# Patient Record
Sex: Female | Born: 1980 | Race: Asian | Hispanic: No | Marital: Married | Smoking: Never smoker
Health system: Southern US, Community
[De-identification: ages and names within clinical notes are randomized; demographics above are authoritative.]

## PROBLEM LIST (undated history)

## (undated) ENCOUNTER — Inpatient Hospital Stay (HOSPITAL_COMMUNITY): Payer: Self-pay

---

## 2010-01-27 ENCOUNTER — Inpatient Hospital Stay (HOSPITAL_COMMUNITY): Admission: AD | Admit: 2010-01-27 | Discharge: 2010-01-27 | Payer: Self-pay | Admitting: Obstetrics and Gynecology

## 2010-01-27 ENCOUNTER — Ambulatory Visit: Payer: Self-pay | Admitting: Nurse Practitioner

## 2010-02-12 ENCOUNTER — Inpatient Hospital Stay (HOSPITAL_COMMUNITY): Admission: AD | Admit: 2010-02-12 | Discharge: 2010-02-12 | Payer: Self-pay | Admitting: Obstetrics & Gynecology

## 2010-02-12 ENCOUNTER — Ambulatory Visit: Payer: Self-pay | Admitting: Obstetrics and Gynecology

## 2010-04-09 ENCOUNTER — Ambulatory Visit (HOSPITAL_COMMUNITY): Admission: RE | Admit: 2010-04-09 | Discharge: 2010-04-09 | Payer: Self-pay | Admitting: Family Medicine

## 2010-07-16 ENCOUNTER — Inpatient Hospital Stay (HOSPITAL_COMMUNITY)
Admission: AD | Admit: 2010-07-16 | Discharge: 2010-07-16 | Payer: Self-pay | Source: Home / Self Care | Attending: Family Medicine | Admitting: Family Medicine

## 2010-09-10 ENCOUNTER — Inpatient Hospital Stay (HOSPITAL_COMMUNITY)
Admission: AD | Admit: 2010-09-10 | Discharge: 2010-09-13 | DRG: 766 | Disposition: A | Discharge status: Home / Self Care | Payer: Medicaid Other | Source: Ambulatory Visit | Attending: Obstetrics & Gynecology | Admitting: Obstetrics & Gynecology

## 2010-09-10 ENCOUNTER — Other Ambulatory Visit: Payer: Self-pay | Admitting: Obstetrics & Gynecology

## 2010-09-10 DIAGNOSIS — O9903 Anemia complicating the puerperium: Secondary | ICD-10-CM | POA: Diagnosis not present

## 2010-09-10 DIAGNOSIS — D649 Anemia, unspecified: Secondary | ICD-10-CM | POA: Diagnosis not present

## 2010-09-10 LAB — CBC
MCV: 89.1 fL (ref 78.0–100.0)
Platelets: 170 10*3/uL (ref 150–400)
RDW: 13.9 % (ref 11.5–15.5)
WBC: 9.4 10*3/uL (ref 4.0–10.5)

## 2010-09-10 LAB — RPR: RPR Ser Ql: NONREACTIVE

## 2010-09-11 DIAGNOSIS — O9903 Anemia complicating the puerperium: Secondary | ICD-10-CM

## 2010-09-11 DIAGNOSIS — D649 Anemia, unspecified: Secondary | ICD-10-CM

## 2010-09-11 LAB — CBC
Hemoglobin: 7.8 g/dL — ABNORMAL LOW (ref 12.0–15.0)
MCH: 30.2 pg (ref 26.0–34.0)
MCHC: 34.1 g/dL (ref 30.0–36.0)
MCV: 89.2 fL (ref 78.0–100.0)
Platelets: 140 10*3/uL — ABNORMAL LOW (ref 150–400)
RDW: 14.1 % (ref 11.5–15.5)
RDW: 13.9 % (ref 11.5–15.5)
WBC: 14.6 10*3/uL — ABNORMAL HIGH (ref 4.0–10.5)

## 2010-09-21 NOTE — Op Note (Addendum)
Virginia Mooney, MALES             ACCOUNT NO.:  [id number]  MEDICAL RECORD NO.:  [id number]           PATIENT TYPE:  I  LOCATION:  9135                          [facility]  PHYSICIAN:  Lesly Dukes, M.D. DATE OF BIRTH:  1980/08/27  DATE OF PROCEDURE:  09/11/2010 DATE OF DISCHARGE:                              OPERATIVE REPORT   PREOPERATIVE DIAGNOSES: 1. Intrauterine pregnancy at 39 weeks and 2 days. 2. Nonreassuring fetal heart tones.  POSTOPERATIVE DIAGNOSES: 1. Intrauterine pregnancy at 39 weeks and 2 days. 2. Nonreassuring fetal heart tones.  PROCEDURE:  Primary low-transverse cesarean section via Pfannenstiel.  SURGEON:  Lesly Dukes, MD and Maryelizabeth Kaufmann, MD.  ANESTHESIA:  Epidural.  IV FLUIDS:  3700 mL.  URINE OUTPUT:  400 mL of clear urine at the end of the procedure.  ESTIMATED BLOOD LOSS:  1500 mL.  FINDINGS:  A viable female infant in occiput transverse presentation with meconium-stained amniotic fluid.  Apgars were 6, 8, and 9.  Cord pH was 7.33.  Weight was 5 pounds and 11 ounces.  Normal uterus and adnexa bilaterally.  SPECIMENS:  Placenta sent to Pathology.  COMPLICATIONS:  None immediate.  INDICATIONS:  This is a 30 year old gravida 1 who presented on the day of admission at 39 weeks and 2 days with spontaneous rupture of membranes with meconium.  The patient was progressed spontaneously, and the patient was expectantly managed.  The patient did have some intermittent late decelerations that resolved.  Variability remained stable and moderate.  Fetal heart rate remained in the 120s-130s.  The patient then was making adequate cervical change.  She was augmented with Pitocin because her contractions were protracted.  She progressed to complete and +1.  However, with pushing, there was notable fetal bradycardia and then marked variability.  It was also noted that the infant to be asynclitic because the patient was remote from  immediate delivery and she was only at +1 station, decision was made to proceed with a primary low-transverse cesarean section for nonreassuring fetal heart tones.  The patient was counseled with risks, benefits, and alternatives and she desired to proceed with cesarean section for the aforementioned reasons.  PROCEDURE IN DETAIL:  After informed consent was obtained with the translator, the patient was taken back to the operative suite where epidural was redosed.  After anesthesia was found to be adequate, the patient was placed in the dorsal supine position with a leftward tilt. The patient was prepped and draped in normal sterile fashion.  Again, anesthesia was found to be adequate and a Pfannenstiel skin incision was then made with a scalpel and carried down through to the underlying fascia.  The fascial incision was then incised in the midline.  The fascial incision was then extended laterally with the Mayo scissors. The superior aspect of the fascial incision was then grasped with Kocher clamps, elevated, and tented up and the rectus muscles were then dissected off bluntly.  Attention was then turned to the inferior aspect, which in a similar fashion was grasped with the Kocher clamps, elevated, tented up, and the rectus muscle were then separated in the midline  and the peritoneum was entered bluntly.  An Alexis O ring retractor was then inserted into the abdomen.  The lower uterine segment was then incised in transverse fashion with a scalpel.  The uterine incision was then extended manually.  It was found to be in occiput transverse presentation, was delivered otherwise in vertex presentation atraumatically.  Mouth and nose were bulb suctioned.  Cord was cut and clamped.  The infant was handed off to awaiting NICU.  Apgars were 6, 8, and 9.  Cord pH was 7.33.  Cord blood was sampled.  Weight was 5 pounds and 11 ounces.  Placenta was then delivered spontaneously intact  with three-vessel cord.  The uterus was then cleared of all clots and debris. The uterine incision was then repaired with an 0 Vicryl in a two-layer closure with the initial layer being running interlocking and the second layer being imbricated.  The uterine incision did extend laterally into the left uterine artery and it was repaired.  Hemostasis was found to be adequate, and again the uterine incision was then closed using a two- layer closure with a second layer being imbricated.  Hemostasis was found to be adequate.  Bilateral adnexa were then inspected and were found to be normal.  The peritoneum was then irrigated, cleared of all clots and debris, and there was one area on the uterine incision that needed one figure-of-eight that was repaired using a 2-0 Monocryl, otherwise the uterine incision was then hemostatic.  The fascial incision was then repaired with running stitch using 0 Vicryl.  The subcutaneous tissue was then irrigated.  Additional hemostasis was then obtained with the electrocautery.  The skin was then closed with staples.  Lap, needle, and sponge counts were correct x2.  Antibiotics were given perioperatively.  There were no complications immediately, and the patient was taken back to the recovery area in stable condition.    ______________________________ Maryelizabeth Kaufmann, MD   ______________________________ Lesly Dukes, M.D.    LC/MEDQ  D:  09/11/2010  T:  09/11/2010  Job:  [id number]  Electronically Signed by Maryelizabeth Kaufmann MD on 09/21/2010 10:06:53 AM Electronically Signed by Elsie Lincoln M.D. on 10/30/2010 11:20:24 AM

## 2010-09-28 LAB — URINALYSIS, ROUTINE W REFLEX MICROSCOPIC
Protein, ur: NEGATIVE mg/dL
Urobilinogen, UA: 0.2 mg/dL (ref 0.0–1.0)

## 2010-09-28 LAB — FETAL FIBRONECTIN: Fetal Fibronectin: POSITIVE — AB

## 2010-10-03 LAB — URINALYSIS, ROUTINE W REFLEX MICROSCOPIC
Bilirubin Urine: NEGATIVE
Ketones, ur: >80 mg/dL — AB
Nitrite: NEGATIVE
Protein, ur: NEGATIVE mg/dL
Urobilinogen, UA: 0.2 mg/dL (ref 0.0–1.0)

## 2010-10-03 LAB — COMPREHENSIVE METABOLIC PANEL
AST: 14 U/L (ref 0–37)
Albumin: 3.8 g/dL (ref 3.5–5.2)
CO2: 24 mEq/L (ref 19–32)
Calcium: 8.9 mg/dL (ref 8.4–10.5)
Creatinine, Ser: 0.51 mg/dL (ref 0.4–1.2)
GFR calc Af Amer: >60 mL/min (ref >60)
GFR calc non Af Amer: >60 mL/min (ref >60)

## 2010-10-03 LAB — CBC
MCH: 31.2 pg (ref 26.0–34.0)
MCHC: 34.4 g/dL (ref 30.0–36.0)
Platelets: 187 10*3/uL (ref 150–400)

## 2010-10-04 LAB — URINALYSIS, ROUTINE W REFLEX MICROSCOPIC
Bilirubin Urine: NEGATIVE
Leukocytes, UA: NEGATIVE
Nitrite: NEGATIVE
Specific Gravity, Urine: >1.03 — ABNORMAL HIGH (ref 1.005–1.030)
pH: 6 (ref 5.0–8.0)

## 2010-10-04 LAB — URINE MICROSCOPIC-ADD ON

## 2010-10-06 NOTE — Discharge Summary (Signed)
  Virginia Mooney, Virginia Mooney             ACCOUNT NO.:  [id number]  MEDICAL RECORD NO.:  [id number]           PATIENT TYPE:  I  LOCATION:  9135                          [facility]  PHYSICIAN:  Catalina Antigua, MD     DATE OF BIRTH:  October 11, 1980  DATE OF ADMISSION:  09/10/2010 DATE OF DISCHARGE:                              DISCHARGE SUMMARY   REASON FOR ADMISSION:  Pregnancy at term with first-degree low- transverse cesarean section by Dr. Penne Lash and Dr. Orvan Falconer for nonreassuring fetal heart rate tones.  FINDINGS:  There is a viable female infant vertex presentation with Apgars of 6, 8 and 10.  pH on cord gas was 7.33, weight was 5 pounds 11 ounces.  DISCHARGE DIAGNOSIS:  Pregnancy at term with first-degree low-transverse cesarean section by Dr. Penne Lash and Dr. Orvan Falconer for nonreassuring fetal heart rate tones.  HOSPITAL COURSE:  Has been uneventful.  The patient is up ambulating well, taking p.o. fluids and solids well, has had a bowel movement, is voiding without any problems and today is ready for discharge.  DIET:  As tolerated.  ACTIVITY LEVEL:  No heavy lifting or driving for 2 weeks postop.  FOLLOWUP:  The [facility] Love nurses is to go out on postop day 5 through 7 and remove her staples.  No blood pressure check and she is to follow up with the [facility] in 6 weeks.  PHYSICAL EXAMINATION:  VITAL SIGNS:  Today, were stable. HEART:  Regular rhythm and rate. LUNGS:  Clear to auscultation bilaterally. ABDOMEN:  Soft.  Bowel sounds are present in all four quadrants. SKIN:  Incision is intact.  There is no redness, drainage or swelling. Fundus is firm.  Lochia is small amount. EXTREMITIES:  There is no edema in the lower extremities.  DISCHARGE MEDICATIONS: 1. FeSO4 325 1 p.o. b.i.d. 2. Motrin 600 p.o. q.6 h. p.r.n. cramping. 3. Percocet 5/325 one p.o. q.4 h. p.r.n. pain.     Zerita Boers, N.M.   ______________________________ Catalina Antigua, MD    DL/MEDQ  D:  [id number]  T:  09/13/2010  Job:  [id number]  cc:   [facility]  Electronically Signed by Wyvonnia Dusky N.M. on 10/04/2010 09:14:08 AM Electronically Signed by Catalina Antigua  on 10/06/2010 02:03:12 PM

## 2010-11-26 ENCOUNTER — Encounter (INDEPENDENT_AMBULATORY_CARE_PROVIDER_SITE_OTHER): Payer: Medicaid Other | Admitting: Obstetrics and Gynecology

## 2010-11-27 NOTE — Group Therapy Note (Signed)
Virginia Mooney, Virginia Mooney             ACCOUNT NO.:  [id number]  MEDICAL RECORD NO.:  [id number]           PATIENT TYPE:  A  LOCATION:  [facility]                   FACILITY:  [facility]  PHYSICIAN:  Argentina Donovan, MD        DATE OF BIRTH:  1980-07-28  DATE OF SERVICE:  11/26/2010                                 CLINIC NOTE  The patient is a 30 year old gravida 1, para 1-0-0-1 who has only been married for 1 year who is native of [place] in [place] who got her prenatal care at the health department, was delivered here at [facility] on February 9 of this year.  Following the C-section which was done she has had at the last minute when she was fully dilated but was unable to push the baby out in which she had a comfortable labor secondary to the epidural.  She delivered about 11 o'clock at night and by morning her lower extremities were still numb, although the epidural had been stopped considerably earlier than that.  I do not have her records so I am not sure whether she got Duramorph or not, but I expect she did.  She is not sure what they did to her feet she said, but they apparently got an anesthetic consultation in to see her because of numbness had lasted longer than it should have.  Once it wore off, she had her 6-weeks checkup at the [facility].  She takes no medications except multivitamins and iron and numbness seems to persists, it is not constant and seems to come and go and she is walking.  It makes her feel shaky, very unstable and she feels she may fall, although she states she never has.  I am not exactly sure what is going on with this patient, so I am going to get a neurological referral and hopefully they can rule out any significant problem.  IMPRESSION:  Postpartum leg weakness and numbness, intermittent.          ______________________________ Argentina Donovan, MD    PR/MEDQ  D:  11/26/2010  T:  11/27/2010  Job:  [id number]

## 2011-10-24 ENCOUNTER — Encounter (HOSPITAL_BASED_OUTPATIENT_CLINIC_OR_DEPARTMENT_OTHER): Payer: Self-pay

## 2011-10-24 ENCOUNTER — Emergency Department (INDEPENDENT_AMBULATORY_CARE_PROVIDER_SITE_OTHER): Payer: Medicaid Other

## 2011-10-24 ENCOUNTER — Emergency Department (HOSPITAL_BASED_OUTPATIENT_CLINIC_OR_DEPARTMENT_OTHER): Payer: Medicaid Other

## 2011-10-24 ENCOUNTER — Emergency Department (HOSPITAL_BASED_OUTPATIENT_CLINIC_OR_DEPARTMENT_OTHER)
Admission: EM | Admit: 2011-10-24 | Discharge: 2011-10-24 | Disposition: A | Discharge status: Home / Self Care | Payer: Medicaid Other | Attending: Emergency Medicine | Admitting: Emergency Medicine

## 2011-10-24 DIAGNOSIS — R05 Cough: Secondary | ICD-10-CM

## 2011-10-24 DIAGNOSIS — M79603 Pain in arm, unspecified: Secondary | ICD-10-CM

## 2011-10-24 DIAGNOSIS — R059 Cough, unspecified: Secondary | ICD-10-CM

## 2011-10-24 DIAGNOSIS — M79609 Pain in unspecified limb: Secondary | ICD-10-CM | POA: Insufficient documentation

## 2011-10-24 MED ORDER — NAPROXEN 500 MG PO TABS: 500.0000 mg | ORAL_TABLET | Freq: Two times a day (BID) | ORAL | Status: DC

## 2011-10-24 MED ORDER — BENZONATATE 100 MG PO CAPS: 100.0000 mg | ORAL_CAPSULE | Freq: Three times a day (TID) | ORAL | Status: AC

## 2011-10-24 MED ORDER — IBUPROFEN 800 MG PO TABS
800.0000 mg | ORAL_TABLET | Freq: Once | ORAL | Status: AC
  Administered 2011-10-24: 800 mg via ORAL
  Filled 2011-10-24: qty 1

## 2011-10-24 NOTE — ED Provider Notes (Signed)
History     CSN: [id number]  Arrival date & time 10/24/11  1139   First MD Initiated Contact with Patient 10/24/11 1201      Chief Complaint  Patient presents with  . Cough    (Consider location/radiation/quality/duration/timing/severity/associated sxs/prior treatment) HPI Comments: Also complains of intermittent bilateral arm pain. Present in the upper portion of her arm. Has full range of motion. No specific exacerbating or alleviating measures  Patient is a 31 y.o. female presenting with URI. The history is provided by the patient. No language interpreter was used.  URI The primary symptoms include cough. Primary symptoms do not include fever, fatigue, headaches, sore throat, wheezing, abdominal pain, nausea, vomiting, myalgias or arthralgias. The current episode started 6 to 7 days ago. This is a new problem. The problem has not changed since onset. The cough began 6 to 7 days ago. The cough is new. The cough is dry. There is nondescript sputum produced.  Symptoms associated with the illness include congestion and rhinorrhea. The illness is not associated with chills, plugged ear sensation, facial pain or sinus pressure.    History reviewed. No pertinent past medical history.  Past Surgical History  Procedure Date  . Cesarean section     History reviewed. No pertinent family history.  History  Substance Use Topics  . Smoking status: Never Smoker   . Smokeless tobacco: Never Used  . Alcohol Use: No    OB History    Grav Para Term Preterm Abortions TAB SAB Ect Mult Living                  Review of Systems  Constitutional: Negative for fever, chills, activity change, appetite change and fatigue.  HENT: Positive for congestion and rhinorrhea. Negative for sore throat, neck pain, neck stiffness and sinus pressure.   Respiratory: Positive for cough. Negative for shortness of breath and wheezing.   Cardiovascular: Negative for chest pain and palpitations.   Gastrointestinal: Negative for nausea, vomiting and abdominal pain.  Genitourinary: Negative for dysuria, urgency, frequency and flank pain.  Musculoskeletal: Negative for myalgias, back pain and arthralgias.  Neurological: Negative for dizziness, weakness, light-headedness, numbness and headaches.  All other systems reviewed and are negative.    Allergies  Review of patient's allergies indicates no known allergies.  Home Medications   Current Outpatient Rx  Name Route Sig Dispense Refill  . ACETAMINOPHEN 500 MG PO TABS Oral Take 1,000 mg by mouth every 6 (six) hours as needed.    . NYQUIL PO Oral Take 15 mLs by mouth as needed.    Marland Kitchen BENZONATATE 100 MG PO CAPS Oral Take 1 capsule (100 mg total) by mouth every 8 (eight) hours. 21 capsule 0  . NAPROXEN 500 MG PO TABS Oral Take 1 tablet (500 mg total) by mouth 2 (two) times daily. 30 tablet 0    BP 107/70  Pulse 95  Temp(Src) 98.3 F (36.8 C) (Oral)  Resp 17  Ht 4\' 11"  (1.499 m)  Wt 138 lb 9.6 oz (62.869 kg)  BMI 27.99 kg/m2  SpO2 98%  LMP 10/04/2011  Physical Exam  Nursing note and vitals reviewed. Constitutional: She is oriented to person, place, and time. She appears well-developed and well-nourished. No distress.  HENT:  Head: Normocephalic and atraumatic.  Mouth/Throat: Oropharynx is clear and moist.  Eyes: Conjunctivae and EOM are normal. Pupils are equal, round, and reactive to light.  Neck: Normal range of motion. Neck supple.  Cardiovascular: Normal rate, regular rhythm, normal heart  sounds and intact distal pulses.  Exam reveals no gallop and no friction rub.   No murmur heard. Pulmonary/Chest: Effort normal and breath sounds normal. No respiratory distress.  Abdominal: Soft. Bowel sounds are normal. There is no tenderness. There is no rebound and no guarding.  Musculoskeletal: Normal range of motion. She exhibits no edema and no tenderness.       Could not elicit any pain in the UE bilaterally.  No swelling.   Full rom  Lymphadenopathy:    She has no cervical adenopathy.  Neurological: She is alert and oriented to person, place, and time. No cranial nerve deficit.  Skin: Skin is warm and dry.    ED Course  Procedures (including critical care time)  Labs Reviewed - No data to display Dg Chest 2 View  10/24/2011  *RADIOLOGY REPORT*  Clinical Data: Cough  CHEST - 2 VIEW  Comparison: None.  Findings: Cardiomediastinal silhouette is unremarkable.  No acute infiltrate or pleural effusion.  No pulmonary edema.  Bony thorax is unremarkable.  IMPRESSION: No active disease.  Original Report Authenticated By: Natasha Mead, M.D.     1. Cough   2. Arm pain       MDM  Negative x-ray was provided Jerilynn Som for her cough. Was unable to elicit any sort of pain in her upper extremities. Will provide Naprosyn. No indication for testing or imaging at this time. There is no evidence of DVT or swelling. Instructed to followup with provided primary care physician.        Dayton Bailiff, MD 10/24/11 1325

## 2011-10-24 NOTE — ED Notes (Signed)
Pt states that she has had a cough for about 4-5 days, and c/o of intermittent arm pain.

## 2011-10-24 NOTE — Discharge Instructions (Signed)

## 2011-11-10 ENCOUNTER — Emergency Department (INDEPENDENT_AMBULATORY_CARE_PROVIDER_SITE_OTHER): Payer: Medicaid Other

## 2011-11-10 ENCOUNTER — Emergency Department (HOSPITAL_BASED_OUTPATIENT_CLINIC_OR_DEPARTMENT_OTHER)
Admission: EM | Admit: 2011-11-10 | Discharge: 2011-11-10 | Disposition: A | Discharge status: Home / Self Care | Payer: Medicaid Other | Attending: Emergency Medicine | Admitting: Emergency Medicine

## 2011-11-10 ENCOUNTER — Encounter (HOSPITAL_BASED_OUTPATIENT_CLINIC_OR_DEPARTMENT_OTHER): Payer: Self-pay | Admitting: Emergency Medicine

## 2011-11-10 DIAGNOSIS — R002 Palpitations: Secondary | ICD-10-CM | POA: Insufficient documentation

## 2011-11-10 DIAGNOSIS — I498 Other specified cardiac arrhythmias: Secondary | ICD-10-CM | POA: Insufficient documentation

## 2011-11-10 DIAGNOSIS — R079 Chest pain, unspecified: Secondary | ICD-10-CM | POA: Insufficient documentation

## 2011-11-10 DIAGNOSIS — M542 Cervicalgia: Secondary | ICD-10-CM | POA: Insufficient documentation

## 2011-11-10 DIAGNOSIS — R5381 Other malaise: Secondary | ICD-10-CM | POA: Insufficient documentation

## 2011-11-10 DIAGNOSIS — R42 Dizziness and giddiness: Secondary | ICD-10-CM

## 2011-11-10 DIAGNOSIS — R5383 Other fatigue: Secondary | ICD-10-CM | POA: Insufficient documentation

## 2011-11-10 DIAGNOSIS — R9431 Abnormal electrocardiogram [ECG] [EKG]: Secondary | ICD-10-CM | POA: Insufficient documentation

## 2011-11-10 DIAGNOSIS — R0789 Other chest pain: Secondary | ICD-10-CM

## 2011-11-10 LAB — COMPREHENSIVE METABOLIC PANEL
ALT: 13 U/L (ref 0–35)
AST: 19 U/L (ref 0–37)
Alkaline Phosphatase: 94 U/L (ref 39–117)
CO2: 26 mEq/L (ref 19–32)
GFR calc Af Amer: >90 mL/min (ref >90)
GFR calc non Af Amer: >90 mL/min (ref >90)
Glucose, Bld: 111 mg/dL — ABNORMAL HIGH (ref 70–99)
Potassium: 3.6 mEq/L (ref 3.5–5.1)
Sodium: 139 mEq/L (ref 135–145)
Total Protein: 7.7 g/dL (ref 6.0–8.3)

## 2011-11-10 LAB — CBC
MCH: 29.9 pg (ref 26.0–34.0)
MCHC: 35.4 g/dL (ref 30.0–36.0)
Platelets: 218 10*3/uL (ref 150–400)
RBC: 4.69 MIL/uL (ref 3.87–5.11)
RDW: 13.3 % (ref 11.5–15.5)

## 2011-11-10 LAB — PREGNANCY, URINE: Preg Test, Ur: NEGATIVE

## 2011-11-10 LAB — DIFFERENTIAL
Basophils Absolute: 0 10*3/uL (ref 0.0–0.1)
Lymphocytes Relative: 28 % (ref 12–46)
Lymphs Abs: 2.5 10*3/uL (ref 0.7–4.0)
Neutrophils Relative %: 63 % (ref 43–77)

## 2011-11-10 LAB — URINALYSIS, ROUTINE W REFLEX MICROSCOPIC
Glucose, UA: NEGATIVE mg/dL
Leukocytes, UA: NEGATIVE
Protein, ur: NEGATIVE mg/dL
Urobilinogen, UA: 0.2 mg/dL (ref 0.0–1.0)

## 2011-11-10 LAB — TROPONIN I: Troponin I: <0.3 ng/mL (ref <0.30)

## 2011-11-10 MED ORDER — PROPRANOLOL HCL 10 MG PO TABS: 10.0000 mg | ORAL_TABLET | Freq: Every morning | ORAL | Status: DC

## 2011-11-10 MED ORDER — SODIUM CHLORIDE 0.9 % IV SOLN
INTRAVENOUS | Status: DC
  Administered 2011-11-10: 16:00:00 via INTRAVENOUS

## 2011-11-10 MED ORDER — METOPROLOL TARTRATE 1 MG/ML IV SOLN
5.0000 mg | Freq: Once | INTRAVENOUS | Status: AC
  Administered 2011-11-10: 5 mg via INTRAVENOUS
  Filled 2011-11-10: qty 5

## 2011-11-10 NOTE — ED Provider Notes (Addendum)
History     CSN: [id number]  Arrival date & time 11/10/11  1425   First MD Initiated Contact with Patient 11/10/11 1455      Chief Complaint  Patient presents with  . Chest Pain    (Consider location/radiation/quality/duration/timing/severity/associated sxs/prior treatment) HPI Comments: Patient is a 31 year old woman of [place] origin who complains of weakness, dizziness, and a feeling like heaviness in her chest and anterior neck. The feeling is like a discomfort. She also feels dizzy. She knows her heart rate is rapid today. She says her blood pressure usually runs around 99 systolic and now just above that and she thinks this is perhaps the cause of the trouble. There has been no prior similar episode.  Patient is a 31 y.o. female presenting with chest pain.  Chest Pain Episode onset: Today. Chest pain occurs constantly. The chest pain is unchanged. The severity of the pain is moderate. Quality: She describes the feeling as dizziness and a discomfort in her upper chest and anterior neck. Exacerbated by: Nothing. Primary symptoms include palpitations. Primary symptoms comment: Dizziness. She tried nothing for the symptoms. Risk factors include no known risk factors. Past medical history comments: No significant illnesses. Her only prior surgery was a cesarean section about a year ago.     History reviewed. No pertinent past medical history.  Past Surgical History  Procedure Date  . Cesarean section     No family history on file.  History  Substance Use Topics  . Smoking status: Never Smoker   . Smokeless tobacco: Never Used  . Alcohol Use: No    OB History    Grav Para Term Preterm Abortions TAB SAB Ect Mult Living                  Review of Systems  Constitutional: Negative.   HENT: Positive for neck pain.        Dry mouth.  Eyes: Negative.   Cardiovascular: Positive for chest pain and palpitations.  Gastrointestinal: Negative.   Genitourinary: Negative.    Skin: Negative.   Neurological: Negative.   Psychiatric/Behavioral: Negative.     Allergies  Review of patient's allergies indicates no known allergies.  Home Medications   Current Outpatient Rx  Name Route Sig Dispense Refill  . ACETAMINOPHEN 500 MG PO TABS Oral Take 1,000 mg by mouth every 6 (six) hours as needed.    Marland Kitchen NAPROXEN 500 MG PO TABS Oral Take 1 tablet (500 mg total) by mouth 2 (two) times daily. 30 tablet 0  . NYQUIL PO Oral Take 15 mLs by mouth as needed.      BP 125/83  Pulse 106  Temp(Src) 97.7 F (36.5 C) (Oral)  Resp 16  Ht 4\' 11"  (1.499 m)  Wt 138 lb 1.6 oz (62.642 kg)  BMI 27.89 kg/m2  SpO2 100%  LMP 10/04/2011  Physical Exam  Nursing note and vitals reviewed. Constitutional: She is oriented to person, place, and time. She appears well-developed and well-nourished.       In mild distress, complaining of pain in her upper chest and anterior neck, dizziness.  HENT:  Head: Normocephalic and atraumatic.  Right Ear: External ear normal.  Left Ear: External ear normal.  Mouth/Throat: Oropharynx is clear and moist.  Eyes: Conjunctivae and EOM are normal. Pupils are equal, round, and reactive to light.       No nystagmus.  Neck: Normal range of motion. Neck supple.  Cardiovascular: Regular rhythm and normal heart sounds.  Tachycardia between 110 and 120 as noted on her EKG and on the monitor.  Pulmonary/Chest: Effort normal and breath sounds normal.  Abdominal: Soft. Bowel sounds are normal.  Musculoskeletal: Normal range of motion. She exhibits no edema and no tenderness.  Neurological: She is alert and oriented to person, place, and time.       No sensory or motor deficit.  Skin: Skin is warm and dry.  Psychiatric: She has a normal mood and affect. Her behavior is normal.    ED Course  Procedures (including critical care time)   2:56 PM  Date: 11/10/2011  Rate:104  Rhythm: sinus tachycardia  QRS Axis: left  Intervals: normal PQRS:  Left atrial abnormality.  ST/T Wave abnormalities: nonspecific T wave changes  Conduction Disutrbances:none  Narrative Interpretation: Abnormal EKG.  Old EKG Reviewed: none available  3:30 PM Patient was seen and had physical examination laboratory tests were ordered. IV metoprolol was ordered.  Results for orders placed during the hospital encounter of 11/10/11  CBC      Component Value Range   WBC 8.7  4.0 - 10.5 (K/uL)   RBC 4.69  3.87 - 5.11 (MIL/uL)   Hemoglobin 14.0  12.0 - 15.0 (g/dL)   HCT 40.9  81.1 - 91.4 (%)   MCV 84.4  78.0 - 100.0 (fL)   MCH 29.9  26.0 - 34.0 (pg)   MCHC 35.4  30.0 - 36.0 (g/dL)   RDW 78.2  95.6 - 21.3 (%)   Platelets 218  150 - 400 (K/uL)  DIFFERENTIAL      Component Value Range   Neutrophils Relative 63  43 - 77 (%)   Neutro Abs 5.4  1.7 - 7.7 (K/uL)   Lymphocytes Relative 28  12 - 46 (%)   Lymphs Abs 2.5  0.7 - 4.0 (K/uL)   Monocytes Relative 8  3 - 12 (%)   Monocytes Absolute 0.7  0.1 - 1.0 (K/uL)   Eosinophils Relative 1  0 - 5 (%)   Eosinophils Absolute 0.1  0.0 - 0.7 (K/uL)   Basophils Relative 0  0 - 1 (%)   Basophils Absolute 0.0  0.0 - 0.1 (K/uL)  COMPREHENSIVE METABOLIC PANEL      Component Value Range   Sodium 139  135 - 145 (mEq/L)   Potassium 3.6  3.5 - 5.1 (mEq/L)   Chloride 102  96 - 112 (mEq/L)   CO2 26  19 - 32 (mEq/L)   Glucose, Bld 111 (*) 70 - 99 (mg/dL)   BUN 10  6 - 23 (mg/dL)   Creatinine, Ser 0.86  0.50 - 1.10 (mg/dL)   Calcium 9.6  8.4 - 57.8 (mg/dL)   Total Protein 7.7  6.0 - 8.3 (g/dL)   Albumin 4.1  3.5 - 5.2 (g/dL)   AST 19  0 - 37 (U/L)   ALT 13  0 - 35 (U/L)   Alkaline Phosphatase 94  39 - 117 (U/L)   Total Bilirubin 0.5  0.3 - 1.2 (mg/dL)   GFR calc non Af Amer >90  >90 (mL/min)   GFR calc Af Amer >90  >90 (mL/min)  URINALYSIS, ROUTINE W REFLEX MICROSCOPIC      Component Value Range   Color, Urine YELLOW  YELLOW    APPearance CLEAR  CLEAR    Specific Gravity, Urine 1.005  1.005 - 1.030    pH 7.5   5.0 - 8.0    Glucose, UA NEGATIVE  NEGATIVE (mg/dL)   Hgb urine dipstick  NEGATIVE  NEGATIVE    Bilirubin Urine NEGATIVE  NEGATIVE    Ketones, ur NEGATIVE  NEGATIVE (mg/dL)   Protein, ur NEGATIVE  NEGATIVE (mg/dL)   Urobilinogen, UA 0.2  0.0 - 1.0 (mg/dL)   Nitrite NEGATIVE  NEGATIVE    Leukocytes, UA NEGATIVE  NEGATIVE   PREGNANCY, URINE      Component Value Range   Preg Test, Ur NEGATIVE  NEGATIVE   D-DIMER, QUANTITATIVE      Component Value Range   D-Dimer, Quant <0.22  0.00 - 0.48 (ug/mL-FEU)  TROPONIN I      Component Value Range   Troponin I <0.30  <0.30 (ng/mL)   Dg Chest 2 View  11/10/2011  *RADIOLOGY REPORT*  Clinical Data: Dizziness.  Chest discomfort.  CHEST - 2 VIEW  Comparison: 10/24/2011  Findings: Artifact overlies the chest.  Heart size is normal. Mediastinal shadows are normal.  Lungs are clear.  No effusions. No bony abnormalities.  IMPRESSION: Normal chest  Original Report Authenticated By: Thomasenia Sales, M.D.   Dg Chest 2 View  10/24/2011  *RADIOLOGY REPORT*  Clinical Data: Cough  CHEST - 2 VIEW  Comparison: None.  Findings: Cardiomediastinal silhouette is unremarkable.  No acute infiltrate or pleural effusion.  No pulmonary edema.  Bony thorax is unremarkable.  IMPRESSION: No active disease.  Original Report Authenticated By: Natasha Mead, M.D.    4:47 PM Lab workup negative.  Pulse rate down, BP down after IV metoprolol.  Will treat for palpitations with Inderal 10 mg qd, and advised followup with Dr. Rodena Medin, her internist.   1. Palpitations          Carleene Cooper III, MD 11/10/11 1530  Carleene Cooper III, MD 11/10/11 [phone]

## 2011-11-10 NOTE — Discharge Instructions (Signed)
Virginia Mooney, you had physical examination, laboratory tests, EKG and chest x-ray to check on you for dizziness, pain in the upper chest and neck, and rapid heart beat.  Fortunately, your tests were all good.  You were given medicine called metoprolol to slow your heartbeat and lower your blood pressure.  Dr. Ignacia Palma has prescribed you a similar medicine called propranolol to take once a day to help keep your heart rate slow.  You will need to make a followup appointment with Dr. Rodena Medin, your internist.

## 2011-11-10 NOTE — ED Notes (Signed)
Previous care handoff documentation incorrect.  Report given to STEPHEN C., RN 

## 2011-11-10 NOTE — ED Notes (Signed)
3-4 days ago started having mid sternal heaviness that radiates into neck

## 2011-11-10 NOTE — ED Notes (Signed)
To x-ray

## 2011-11-10 NOTE — ED Notes (Signed)
Report to l doss rn

## 2011-11-10 NOTE — ED Notes (Signed)
MD at bedside. 

## 2012-01-25 ENCOUNTER — Emergency Department (HOSPITAL_BASED_OUTPATIENT_CLINIC_OR_DEPARTMENT_OTHER): Payer: Medicaid Other

## 2012-01-25 ENCOUNTER — Other Ambulatory Visit: Payer: Self-pay

## 2012-01-25 ENCOUNTER — Encounter (HOSPITAL_BASED_OUTPATIENT_CLINIC_OR_DEPARTMENT_OTHER): Payer: Self-pay | Admitting: Emergency Medicine

## 2012-01-25 ENCOUNTER — Emergency Department (HOSPITAL_BASED_OUTPATIENT_CLINIC_OR_DEPARTMENT_OTHER)
Admission: EM | Admit: 2012-01-25 | Discharge: 2012-01-26 | Disposition: A | Discharge status: Home / Self Care | Payer: Medicaid Other | Attending: Emergency Medicine | Admitting: Emergency Medicine

## 2012-01-25 DIAGNOSIS — R079 Chest pain, unspecified: Secondary | ICD-10-CM | POA: Insufficient documentation

## 2012-01-25 DIAGNOSIS — K219 Gastro-esophageal reflux disease without esophagitis: Secondary | ICD-10-CM

## 2012-01-25 LAB — CBC WITH DIFFERENTIAL/PLATELET
Basophils Absolute: 0 10*3/uL (ref 0.0–0.1)
Basophils Relative: 0 % (ref 0–1)
Eosinophils Relative: 1 % (ref 0–5)
HCT: 39.5 % (ref 36.0–46.0)
MCHC: 35.7 g/dL (ref 30.0–36.0)
MCV: 82.8 fL (ref 78.0–100.0)
Monocytes Absolute: 0.6 10*3/uL (ref 0.1–1.0)
RDW: 12.9 % (ref 11.5–15.5)

## 2012-01-25 LAB — COMPREHENSIVE METABOLIC PANEL
AST: 16 U/L (ref 0–37)
Albumin: 4.1 g/dL (ref 3.5–5.2)
Calcium: 9.9 mg/dL (ref 8.4–10.5)
Creatinine, Ser: 0.6 mg/dL (ref 0.50–1.10)
GFR calc non Af Amer: >90 mL/min (ref >90)

## 2012-01-25 LAB — PREGNANCY, URINE: Preg Test, Ur: NEGATIVE

## 2012-01-25 LAB — D-DIMER, QUANTITATIVE: D-Dimer, Quant: <0.22 ug/mL-FEU (ref 0.00–0.48)

## 2012-01-25 MED ORDER — GI COCKTAIL ~~LOC~~
30.0000 mL | Freq: Once | ORAL | Status: AC
  Administered 2012-01-25: 30 mL via ORAL
  Filled 2012-01-25: qty 30

## 2012-01-25 NOTE — ED Notes (Signed)
States today her blood pressure was high.  She has chest pressure that started today.  Patient was here for same couple months ago.  Nothing dx at that time.

## 2012-01-25 NOTE — ED Notes (Signed)
Patient transported to X-ray 

## 2012-01-26 MED ORDER — OMEPRAZOLE 20 MG PO CPDR: 20.0000 mg | DELAYED_RELEASE_CAPSULE | Freq: Every day | ORAL | Status: DC

## 2012-01-26 NOTE — ED Provider Notes (Signed)
History     CSN: [id number]  Arrival date & time 01/25/12  2141   First MD Initiated Contact with Patient 01/25/12 2257      Chief Complaint  Patient presents with  . Chest Pain    (Consider location/radiation/quality/duration/timing/severity/associated sxs/prior treatment) Patient is a 31 y.o. female presenting with chest pain. The history is provided by the patient. No language interpreter was used.  Chest Pain The chest pain began 6 - 12 hours ago. Duration of episode(s) is 12 hours. Chest pain occurs constantly. The chest pain is unchanged. Associated with: none. At its most intense, the pain is at 4/10. The pain is currently at 4/10. The severity of the pain is moderate. The quality of the pain is described as dull. The pain does not radiate. Exacerbated by: nothing. Pertinent negatives for primary symptoms include no syncope, no shortness of breath and no vomiting.  Pertinent negatives for associated symptoms include no diaphoresis. She tried nothing for the symptoms. Risk factors include no known risk factors.  Pertinent negatives for past medical history include no MI.  Procedure history is negative for cardiac catheterization.     History reviewed. No pertinent past medical history.  Past Surgical History  Procedure Date  . Cesarean section     No family history on file.  History  Substance Use Topics  . Smoking status: Never Smoker   . Smokeless tobacco: Never Used  . Alcohol Use: No    OB History    Grav Para Term Preterm Abortions TAB SAB Ect Mult Living                  Review of Systems  Constitutional: Negative for diaphoresis.  Respiratory: Negative for shortness of breath.   Cardiovascular: Positive for chest pain. Negative for syncope.  Gastrointestinal: Negative for vomiting.  All other systems reviewed and are negative.    Allergies  Propranolol  Home Medications  No current outpatient prescriptions on file.  BP 116/70  Pulse 79  Temp  97.9 F (36.6 C) (Oral)  SpO2 100%  LMP 01/24/2012  Physical Exam  Constitutional: She is oriented to person, place, and time. She appears well-developed and well-nourished. No distress.  HENT:  Head: Normocephalic and atraumatic.  Mouth/Throat: Oropharynx is clear and moist.  Eyes: Conjunctivae are normal. Pupils are equal, round, and reactive to light.  Neck: Normal range of motion. Neck supple.  Cardiovascular: Normal rate and regular rhythm.   Pulmonary/Chest: Effort normal and breath sounds normal. She has no wheezes. She has no rales.  Abdominal: Soft. Bowel sounds are normal. There is no tenderness. There is no rebound and no guarding.  Musculoskeletal: Normal range of motion.  Neurological: She is alert and oriented to person, place, and time. She has normal reflexes.  Skin: Skin is warm and dry.  Psychiatric: She has a normal mood and affect.    ED Course  Procedures (including critical care time)  Labs Reviewed  COMPREHENSIVE METABOLIC PANEL - Abnormal; Notable for the following:    Glucose, Bld 110 (*)     All other components within normal limits  CBC WITH DIFFERENTIAL  TROPONIN I  PREGNANCY, URINE  D-DIMER, QUANTITATIVE   Dg Chest 2 View  01/25/2012  *RADIOLOGY REPORT*  Clinical Data: Chest pressure  CHEST - 2 VIEW  Comparison: 11/10/2011  Findings: Normal heart size.  Linear atelectasis or scar in the right mid lung.  Left lung clear.  No pneumothorax or pleural effusion.  IMPRESSION: No active cardiopulmonary  disease.  Original Report Authenticated By: Donavan Burnet, M.D.     No diagnosis found.    MDM   Date: 01/26/2012  Rate: 65  Rhythm: normal sinus rhythm  QRS Axis: normal  Intervals: normal  ST/T Wave abnormalities: normal  Conduction Disutrbances: none  Narrative Interpretation: unremarkable   > 8 hours of pain with one normal EKG and normal troponin is sufficient to exclude ACS.  No elevated BP in the ED and patient did not take BP at home.   Has symptoms prior with Food cw acid reflux will start prilosec.  Fu with PMD.  Return for worsening symptoms.  Patient verbalizes understanding and agrees to follow up         Coreyon Nicotra Smitty Cords, MD 01/26/12 0010

## 2012-01-30 ENCOUNTER — Emergency Department (HOSPITAL_BASED_OUTPATIENT_CLINIC_OR_DEPARTMENT_OTHER)
Admission: EM | Admit: 2012-01-30 | Discharge: 2012-01-31 | Disposition: A | Discharge status: Home / Self Care | Payer: Medicaid Other | Attending: Emergency Medicine | Admitting: Emergency Medicine

## 2012-01-30 ENCOUNTER — Encounter (HOSPITAL_BASED_OUTPATIENT_CLINIC_OR_DEPARTMENT_OTHER): Payer: Self-pay | Admitting: *Deleted

## 2012-01-30 DIAGNOSIS — Z79899 Other long term (current) drug therapy: Secondary | ICD-10-CM | POA: Insufficient documentation

## 2012-01-30 DIAGNOSIS — R42 Dizziness and giddiness: Secondary | ICD-10-CM

## 2012-01-30 DIAGNOSIS — K219 Gastro-esophageal reflux disease without esophagitis: Secondary | ICD-10-CM | POA: Insufficient documentation

## 2012-01-30 LAB — CBC WITH DIFFERENTIAL/PLATELET
Basophils Absolute: 0 10*3/uL (ref 0.0–0.1)
Basophils Relative: 0 % (ref 0–1)
Eosinophils Absolute: 0 10*3/uL (ref 0.0–0.7)
Eosinophils Relative: 0 % (ref 0–5)
Lymphocytes Relative: 16 % (ref 12–46)
MCHC: 36.2 g/dL — ABNORMAL HIGH (ref 30.0–36.0)
MCV: 82.3 fL (ref 78.0–100.0)
Platelets: 204 10*3/uL (ref 150–400)
RDW: 12.9 % (ref 11.5–15.5)
WBC: 12.9 10*3/uL — ABNORMAL HIGH (ref 4.0–10.5)

## 2012-01-30 LAB — BASIC METABOLIC PANEL
CO2: 25 mEq/L (ref 19–32)
Calcium: 9.8 mg/dL (ref 8.4–10.5)
Creatinine, Ser: 0.6 mg/dL (ref 0.50–1.10)
GFR calc Af Amer: >90 mL/min (ref >90)
GFR calc non Af Amer: >90 mL/min (ref >90)
Sodium: 140 mEq/L (ref 135–145)

## 2012-01-30 LAB — URINALYSIS, ROUTINE W REFLEX MICROSCOPIC
Bilirubin Urine: NEGATIVE
Nitrite: NEGATIVE
Protein, ur: NEGATIVE mg/dL
Specific Gravity, Urine: 1.004 — ABNORMAL LOW (ref 1.005–1.030)
Urobilinogen, UA: 0.2 mg/dL (ref 0.0–1.0)

## 2012-01-30 MED ORDER — SUCRALFATE 1 G PO TABS: 1.0000 g | ORAL_TABLET | Freq: Four times a day (QID) | ORAL | Status: DC

## 2012-01-30 MED ORDER — LORAZEPAM 2 MG/ML IJ SOLN
0.5000 mg | Freq: Once | INTRAMUSCULAR | Status: AC
  Administered 2012-01-31: 0.5 mg via INTRAVENOUS
  Filled 2012-01-30: qty 1

## 2012-01-30 MED ORDER — SODIUM CHLORIDE 0.9 % IV SOLN
Freq: Once | INTRAVENOUS | Status: AC
  Administered 2012-01-30: 23:00:00 via INTRAVENOUS

## 2012-01-30 NOTE — ED Provider Notes (Signed)
History     CSN: [id number]  Arrival date & time 01/30/12  2039   First MD Initiated Contact with Patient 01/30/12 2128      Chief Complaint  Patient presents with  . Dizziness    (Consider location/radiation/quality/duration/timing/severity/associated sxs/prior treatment) Patient is a 31 y.o. female presenting with weakness. The history is provided by the patient and the spouse. No language interpreter was used.  Weakness The primary symptoms include dizziness. The symptoms are worsening.  Additional symptoms include vertigo.  Pt complains of feeling dizzy.  Pt reports she thiks the medication she was given makes her dizzy.   Pt reports it helps reflux but then she feels dizzy.  History reviewed. No pertinent past medical history.  Past Surgical History  Procedure Date  . Cesarean section     History reviewed. No pertinent family history.  History  Substance Use Topics  . Smoking status: Never Smoker   . Smokeless tobacco: Never Used  . Alcohol Use: No    OB History    Grav Para Term Preterm Abortions TAB SAB Ect Mult Living                  Review of Systems  Neurological: Positive for dizziness and vertigo.  All other systems reviewed and are negative.    Allergies  Propranolol  Home Medications   Current Outpatient Rx  Name Route Sig Dispense Refill  . OMEPRAZOLE 20 MG PO CPDR Oral Take 1 capsule (20 mg total) by mouth daily. 30 capsule 0    BP 122/80  Pulse 78  Temp 98.1 F (36.7 C) (Oral)  Resp 18  SpO2 100%  LMP 01/24/2012  Physical Exam  Nursing note and vitals reviewed. Constitutional: She is oriented to person, place, and time. She appears well-developed and well-nourished.  HENT:  Head: Normocephalic and atraumatic.  Right Ear: External ear normal.  Left Ear: External ear normal.  Nose: Nose normal.  Mouth/Throat: Oropharynx is clear and moist.  Eyes: Conjunctivae and EOM are normal. Pupils are equal, round, and reactive to light.   Neck: Normal range of motion. Neck supple.  Cardiovascular: Normal rate and normal heart sounds.   Pulmonary/Chest: Effort normal and breath sounds normal.  Abdominal: Soft. Bowel sounds are normal.  Musculoskeletal: Normal range of motion.  Neurological: She is alert and oriented to person, place, and time. She has normal reflexes.  Skin: Skin is warm.  Psychiatric: She has a normal mood and affect.    ED Course  Procedures (including critical care time)  Labs Reviewed  URINALYSIS, ROUTINE W REFLEX MICROSCOPIC - Abnormal; Notable for the following:    Specific Gravity, Urine 1.004 (*)     All other components within normal limits  PREGNANCY, URINE   No results found.   1. Dizziness   2. Reflux       MDM  Pt given Iv fluids,  Ativan 0.5 mg I will change to carafate.  I advised pt to follow up with her Md.        Lonia Skinner [place], [place] 01/30/12 2359

## 2012-01-30 NOTE — ED Notes (Signed)
Pt reports that she fells like her BP is elevated she had a sudden onset of dry mouth and weakness. Pt states that she "feels bad"

## 2012-01-30 NOTE — ED Notes (Signed)
Pt. Reports she has eaten and had "much to drink" today with no reports of nausea or vomiting and no reports of diarrhea.

## 2012-01-31 MED ORDER — SUCRALFATE 1 G PO TABS: 1.0000 g | ORAL_TABLET | Freq: Four times a day (QID) | ORAL | Status: DC

## 2012-01-31 NOTE — ED Provider Notes (Addendum)
Medical screening examination/treatment/procedure(s) were conducted as a shared visit with non-physician practitioner(s) and myself.  I personally evaluated the patient during the encounter  Pt feeling better. RRR, CTAB. States that she has no dizziness at this time. She is declining a Teacher, English as a foreign language. Feels comfortable with discharge home and F/U with PMD.  Forbes Cellar, MD 01/31/12 2130

## 2012-01-31 NOTE — ED Notes (Signed)
Family at bedside. 

## 2012-01-31 NOTE — ED Notes (Signed)
Pt. States no change after Ativan. Still with questions regarding her condition. Asked MD to see pt.

## 2012-01-31 NOTE — ED Notes (Signed)
MD at bedside. 

## 2012-02-07 ENCOUNTER — Encounter: Payer: Self-pay | Admitting: Family Medicine

## 2012-02-07 ENCOUNTER — Emergency Department (HOSPITAL_BASED_OUTPATIENT_CLINIC_OR_DEPARTMENT_OTHER)
Admission: EM | Admit: 2012-02-07 | Discharge: 2012-02-07 | Disposition: A | Discharge status: Home / Self Care | Payer: Medicaid Other | Attending: Emergency Medicine | Admitting: Emergency Medicine

## 2012-02-07 ENCOUNTER — Encounter (HOSPITAL_BASED_OUTPATIENT_CLINIC_OR_DEPARTMENT_OTHER): Payer: Self-pay | Admitting: *Deleted

## 2012-02-07 ENCOUNTER — Ambulatory Visit (INDEPENDENT_AMBULATORY_CARE_PROVIDER_SITE_OTHER): Payer: Medicaid Other | Admitting: Family Medicine

## 2012-02-07 VITALS — BP 112/75 | HR 76 | Temp 97.7°F | Ht 60.0 in | Wt 130.0 lb

## 2012-02-07 DIAGNOSIS — R51 Headache: Secondary | ICD-10-CM

## 2012-02-07 DIAGNOSIS — F419 Anxiety disorder, unspecified: Secondary | ICD-10-CM

## 2012-02-07 DIAGNOSIS — F411 Generalized anxiety disorder: Secondary | ICD-10-CM | POA: Insufficient documentation

## 2012-02-07 MED ORDER — SALINE NASAL SPRAY 0.65 % NA SOLN: 1.0000 | NASAL | Status: AC | PRN

## 2012-02-07 MED ORDER — ALPRAZOLAM 0.25 MG PO TABS: 0.2500 mg | ORAL_TABLET | Freq: Every evening | ORAL | Status: AC | PRN

## 2012-02-07 NOTE — ED Provider Notes (Signed)
History   This chart was scribed for Nelia Shi, MD by Shari Heritage. The patient was seen in room MH01/MH01. Patient's care was started at 1659.     CSN: [id number]  Arrival date & time 02/07/12  1659   First MD Initiated Contact with Patient 02/07/12 1712      Chief Complaint  Patient presents with  . Facial Pain    (Consider location/radiation/quality/duration/timing/severity/associated sxs/prior treatment) The history is provided by the patient. No language interpreter was used.   Virginia Mooney is a 31 y.o. female who presents to the Emergency Department complaining of right-sided facial discomfort that patient describes as "heaviness" onset 2 weeks ago. Associated symptoms include dizziness, dry mouth and chills. Patient also states that her nose is very dry. Patient denies using any nasal sprays. Patient denies congestion, rhinorrhea or sneezing. Patient reports no aggravating or relieving factors. Patient with surgical h/o cesarean section. Patient has never smoked. Patient was seen at 5:13PM.  Previous Chart Review: Patient was recently seen at [facility] ED by Dr. April Rasch-Palumbo on 01/25/12 complaining of chest pain. Patient rated the pain as 4/10 at maximum and described it as dull and non-radiating. Patient had a chest x-ray that was unremarkable. Dr. April Rasch-Palumbo prescribed Prilosec to treat symptoms consistent with acid reflux. Patient was instructed to follow up with PCP. Patient was discharged in stable condition.   Past Surgical History  Procedure Date  . Cesarean section     No family history on file.  History  Substance Use Topics  . Smoking status: Never Smoker   . Smokeless tobacco: Never Used  . Alcohol Use: No    OB History    Grav Para Term Preterm Abortions TAB SAB Ect Mult Living                  Review of Systems  Constitutional: Positive for chills.  HENT:       Patient is positive for dry mouth and facial discomfort.    Neurological: Positive for dizziness.  All other systems reviewed and are negative.    Allergies  Propranolol  Home Medications   Current Outpatient Rx  Name Route Sig Dispense Refill  . OMEPRAZOLE 20 MG PO CPDR Oral Take 1 capsule (20 mg total) by mouth daily. 30 capsule 0  . ALPRAZOLAM 0.25 MG PO TABS Oral Take 1 tablet (0.25 mg total) by mouth at bedtime as needed for sleep. 30 tablet 0  . SALINE NASAL SPRAY 0.65 % NA SOLN Nasal Place 1 spray into the nose as needed for congestion. 30 mL 12    BP 129/99  Pulse 90  Temp 98 F (36.7 C) (Oral)  Resp 20  SpO2 100%  LMP 01/24/2012  Physical Exam  Nursing note and vitals reviewed. Constitutional: She is oriented to person, place, and time. She appears well-developed. No distress.  HENT:  Head: Normocephalic and atraumatic.  Right Ear: Hearing and tympanic membrane normal.  Left Ear: Hearing and tympanic membrane normal.  Nose: No nasal deformity. Right sinus exhibits no maxillary sinus tenderness and no frontal sinus tenderness. Left sinus exhibits no maxillary sinus tenderness and no frontal sinus tenderness.  Eyes: Pupils are equal, round, and reactive to light.  Neck: Normal range of motion.  Cardiovascular: Normal rate and intact distal pulses.   Pulmonary/Chest: No respiratory distress.  Abdominal: Normal appearance. She exhibits no distension.  Musculoskeletal: Normal range of motion.  Neurological: She is alert and oriented to person, place, and time.  No cranial nerve deficit.  Skin: Skin is warm and dry. No rash noted.  Psychiatric: She has a normal mood and affect. Her behavior is normal.    ED Course  Procedures (including critical care time) DIAGNOSTIC STUDIES: Oxygen Saturation is 100% on room air, normal by my interpretation.     Labs Reviewed - No data to display  No results found.   1. Anxiety       MDM  I personally performed the services described in this documentation, which was scribed in  my presence. The recorded information has been reviewed and considered.    Nelia Shi, MD 02/07/12 [phone]

## 2012-02-07 NOTE — ED Notes (Signed)
Facial pain, dizziness and dry mouth x 2 weeks.

## 2012-02-08 DIAGNOSIS — R519 Headache, unspecified: Secondary | ICD-10-CM | POA: Insufficient documentation

## 2012-02-08 DIAGNOSIS — R51 Headache: Secondary | ICD-10-CM | POA: Insufficient documentation

## 2012-02-08 NOTE — Progress Notes (Signed)
Patient ID: Virginia Mooney, female   DOB: [birth year], 31 y.o.   MRN: [id number]  Patient came to our office I believe by mistake.  She came in with complaints of right sided facial 'heaviness' and dizziness without a prior injury.  She had been seen in the ED for GERD and nausea.  We called up to Dr. Ilda Foil office to get her in as he is listed as her PCP and they have never seen the patient.  They also don't accept Medicaid.  She was offered visit to Urgent care or ED and she wanted to go to ED so was taken down to [facility] ED.

## 2012-08-04 ENCOUNTER — Encounter (HOSPITAL_COMMUNITY): Payer: Self-pay

## 2012-08-04 ENCOUNTER — Inpatient Hospital Stay (HOSPITAL_COMMUNITY)
Admission: AD | Admit: 2012-08-04 | Discharge: 2012-08-04 | Disposition: A | Discharge status: Home / Self Care | Payer: Medicaid Other | Source: Ambulatory Visit | Attending: Obstetrics & Gynecology | Admitting: Obstetrics & Gynecology

## 2012-08-04 DIAGNOSIS — Z3201 Encounter for pregnancy test, result positive: Secondary | ICD-10-CM | POA: Insufficient documentation

## 2012-08-04 LAB — POCT PREGNANCY, URINE: Preg Test, Ur: POSITIVE — AB

## 2012-08-04 NOTE — MAU Provider Note (Signed)
  History     CSN: [id number]  Arrival date and time: 08/04/12 1516   First Provider Initiated Contact with Patient 08/04/12 1708      No chief complaint on file.  HPI Ms. Virginia Mooney is a 32 y.o. G2P1001 at [redacted]w[redacted]d who presents to MAU today for pregnancy confirmation. The patient voices no concerns at this time, she just requires a letter for Medicaid and the [facility]. The patient denies N/V/D, fever, abdominal pain, vaginal bleeding or abnormal discharge. The patient plans to start prenatal care at [facility].   OB History    Grav Para Term Preterm Abortions TAB SAB Ect Mult Living   2 1 1       1       No past medical history on file.  Past Surgical History  Procedure Date  . Cesarean section     No family history on file.  History  Substance Use Topics  . Smoking status: Never Smoker   . Smokeless tobacco: Never Used  . Alcohol Use: No    Allergies:  Allergies  Allergen Reactions  . Propranolol Palpitations    Prescriptions prior to admission  Medication Sig Dispense Refill  . omeprazole (PRILOSEC) 20 MG capsule Take 1 capsule (20 mg total) by mouth daily.  30 capsule  0  . sodium chloride (OCEAN NASAL SPRAY) 0.65 % nasal spray Place 1 spray into the nose as needed for congestion.  30 mL  12    ROS All negative unless otherwise noted in HPI Physical Exam   Blood pressure 92/57, pulse 66, temperature 98.2 F (36.8 C), temperature source Oral, resp. rate 16, last menstrual period 06/22/2012.  Physical Exam  Constitutional: She is oriented to person, place, and time. She appears well-developed and well-nourished. No distress.  HENT:  Head: Normocephalic.  Cardiovascular: Normal rate.   Respiratory: Effort normal.  GI: Soft. She exhibits no distension and no mass. There is no tenderness. There is no rebound and no guarding.  Neurological: She is alert and oriented to person, place, and time.  Skin: Skin is warm and dry. No erythema.  Psychiatric: She has a  normal mood and affect.    MAU Course  Procedures None   Assessment and Plan  A: Positive pregnancy test, EDD by LMP 03/29/13 Patient is 6w 1d today  P: Discharge home Pregnancy confirmation letter given Patient plans to start prenatal care at [facility]. Encouraged her to call and make an appointment as soon as possible.  Patient may return to MAU as needed  Freddi Starr, PA-C 08/04/2012, 5:10 PM

## 2012-08-04 NOTE — MAU Note (Signed)
Patient here for pregnancy test and confirmation letter, denies pain, discharge or vaginal bleeding.

## 2012-08-07 NOTE — MAU Provider Note (Signed)
Attestation of Attending Supervision of Advanced Practitioner: Evaluation and management procedures were performed by the PA/NP/CNM/OB Fellow under my supervision/collaboration. Chart reviewed and agree with management and plan.  Kristyanna Barcelo V 08/07/2012 12:56 AM

## 2013-06-09 ENCOUNTER — Encounter (HOSPITAL_COMMUNITY): Payer: Self-pay | Admitting: *Deleted

## 2014-01-04 IMAGING — CR DG CHEST 2V
2 series · 2 of 2 positions shown · non-contrast
Comparison: 11/10/2011

CLINICAL DATA: Chest pressure

CHEST - 2 VIEW

[w chest pa]
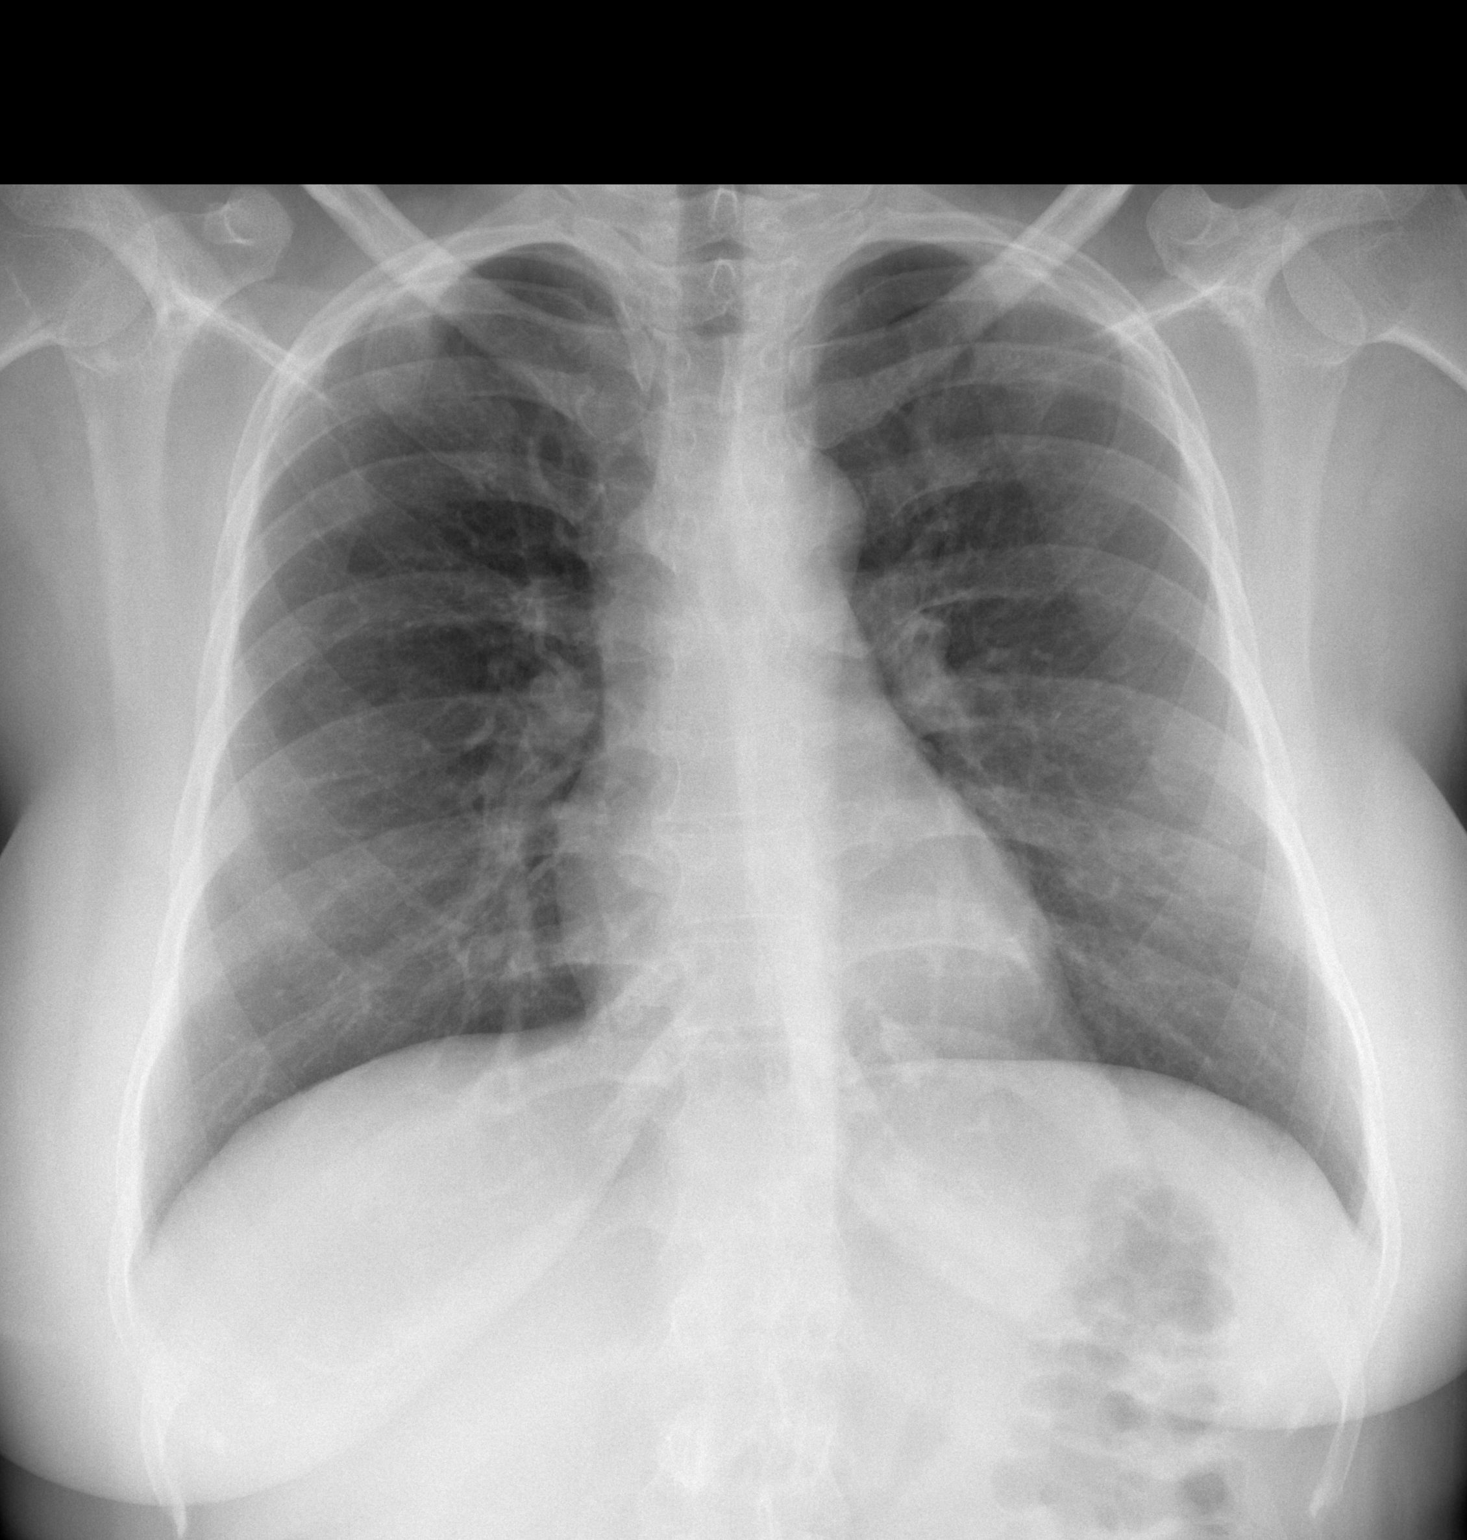

[w chest lat]
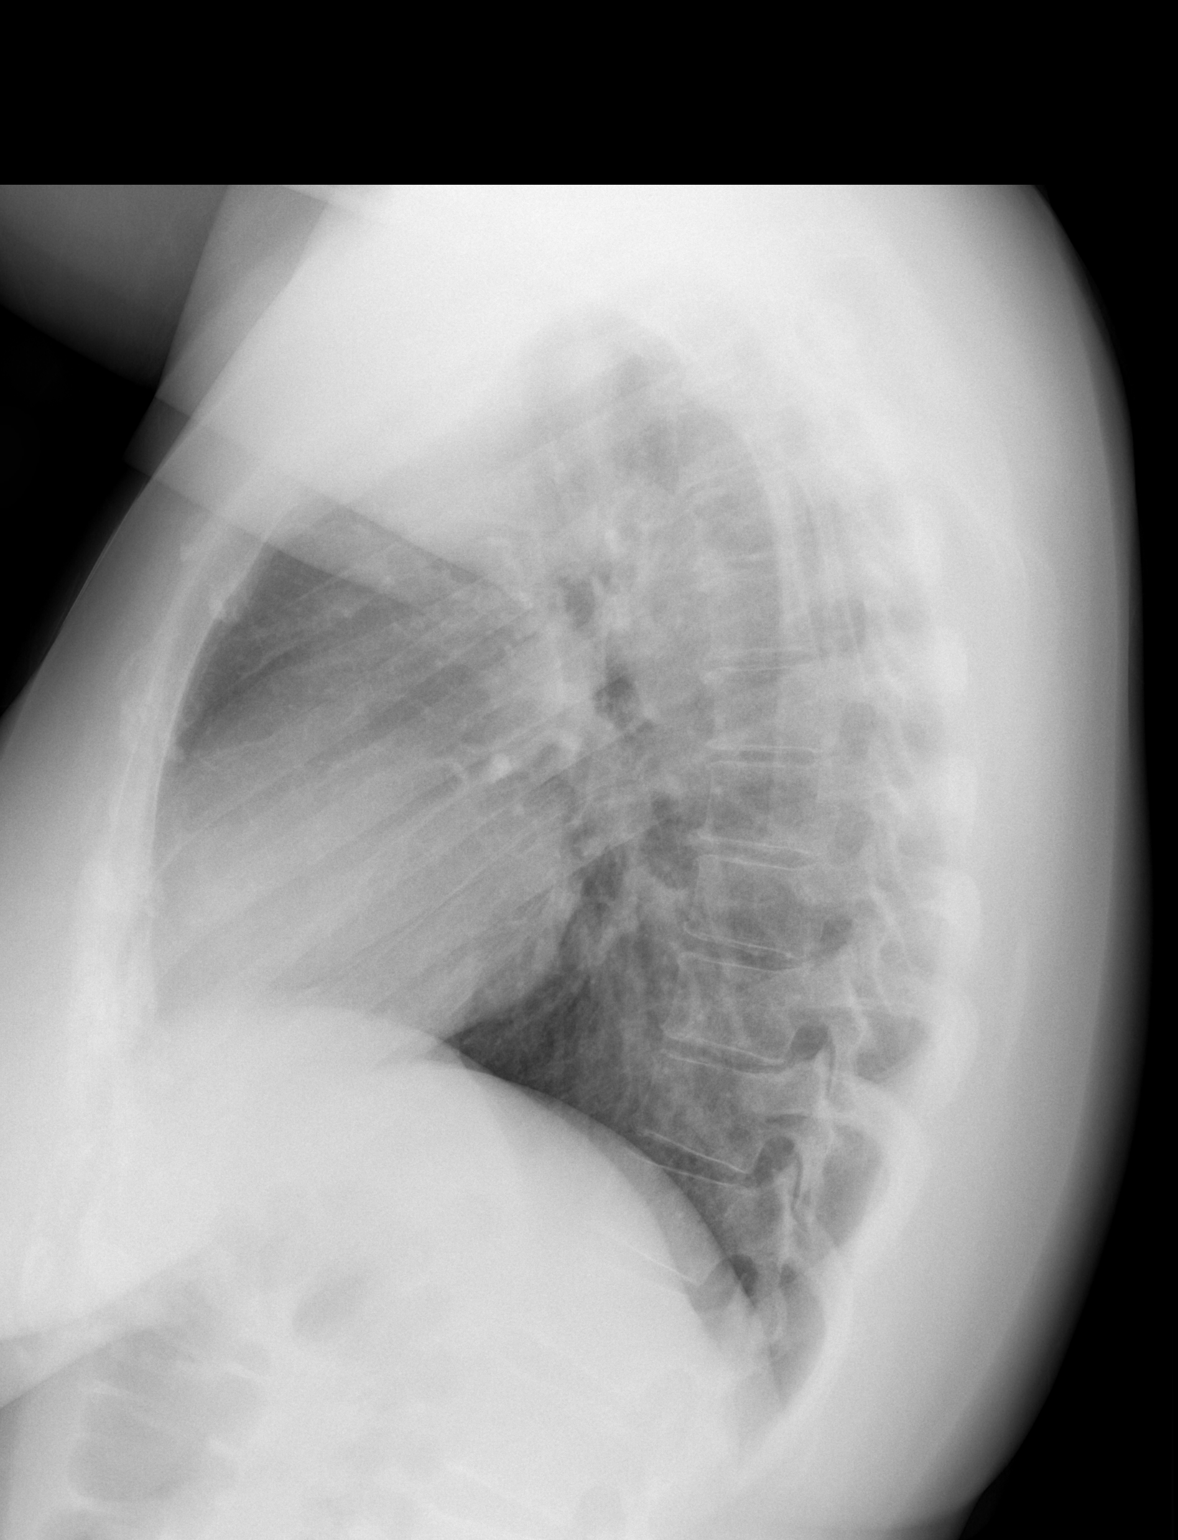

[2 of 2 positions shown; findings below may reference images not displayed]

FINDINGS: Normal heart size.  Linear atelectasis or scar in the
right mid lung.  Left lung clear.  No pneumothorax or pleural
effusion.
IMPRESSION: No active cardiopulmonary disease.

## 2014-05-20 ENCOUNTER — Encounter (HOSPITAL_COMMUNITY): Payer: Self-pay | Admitting: *Deleted

## 2014-08-28 ENCOUNTER — Ambulatory Visit: Payer: Medicaid Other

## 2014-09-23 ENCOUNTER — Ambulatory Visit: Payer: Medicaid Other

## 2016-07-10 ENCOUNTER — Emergency Department (HOSPITAL_COMMUNITY)
Admission: EM | Admit: 2016-07-10 | Discharge: 2016-07-11 | Disposition: A | Discharge status: Home / Self Care | Payer: BLUE CROSS/BLUE SHIELD | Attending: Emergency Medicine | Admitting: Emergency Medicine

## 2016-07-10 ENCOUNTER — Encounter (HOSPITAL_COMMUNITY): Payer: Self-pay

## 2016-07-10 DIAGNOSIS — R42 Dizziness and giddiness: Secondary | ICD-10-CM | POA: Diagnosis not present

## 2016-07-10 DIAGNOSIS — K219 Gastro-esophageal reflux disease without esophagitis: Secondary | ICD-10-CM | POA: Diagnosis present

## 2016-07-10 LAB — BASIC METABOLIC PANEL
ANION GAP: 11 (ref 5–15)
BUN: 6 mg/dL (ref 6–20)
CHLORIDE: 102 mmol/L (ref 101–111)
CO2: 24 mmol/L (ref 22–32)
Calcium: 10 mg/dL (ref 8.9–10.3)
Creatinine, Ser: 0.77 mg/dL (ref 0.44–1.00)
GFR calc Af Amer: >60 mL/min (ref >60)
GFR calc non Af Amer: >60 mL/min (ref >60)
GLUCOSE: 112 mg/dL — AB (ref 65–99)
POTASSIUM: 3.3 mmol/L — AB (ref 3.5–5.1)
Sodium: 137 mmol/L (ref 135–145)

## 2016-07-10 LAB — CBC
HEMATOCRIT: 42.2 % (ref 36.0–46.0)
HEMOGLOBIN: 15 g/dL (ref 12.0–15.0)
MCH: 30.4 pg (ref 26.0–34.0)
MCHC: 35.5 g/dL (ref 30.0–36.0)
MCV: 85.4 fL (ref 78.0–100.0)
Platelets: 256 10*3/uL (ref 150–400)
RBC: 4.94 MIL/uL (ref 3.87–5.11)
RDW: 12.8 % (ref 11.5–15.5)
WBC: 10 10*3/uL (ref 4.0–10.5)

## 2016-07-10 LAB — URINALYSIS, ROUTINE W REFLEX MICROSCOPIC
BILIRUBIN URINE: NEGATIVE
GLUCOSE, UA: NEGATIVE mg/dL
HGB URINE DIPSTICK: NEGATIVE
Ketones, ur: NEGATIVE mg/dL
Leukocytes, UA: NEGATIVE
NITRITE: NEGATIVE
PH: 6 (ref 5.0–8.0)
Protein, ur: NEGATIVE mg/dL
SPECIFIC GRAVITY, URINE: 1.002 — AB (ref 1.005–1.030)

## 2016-07-10 LAB — HEPATIC FUNCTION PANEL
ALBUMIN: 4.6 g/dL (ref 3.5–5.0)
ALT: 17 U/L (ref 14–54)
AST: 22 U/L (ref 15–41)
Alkaline Phosphatase: 85 U/L (ref 38–126)
BILIRUBIN TOTAL: 0.9 mg/dL (ref 0.3–1.2)
Bilirubin, Direct: 0.1 mg/dL (ref 0.1–0.5)
Indirect Bilirubin: 0.8 mg/dL (ref 0.3–0.9)
Total Protein: 8.3 g/dL — ABNORMAL HIGH (ref 6.5–8.1)

## 2016-07-10 LAB — I-STAT BETA HCG BLOOD, ED (MC, WL, AP ONLY)

## 2016-07-10 LAB — CBG MONITORING, ED: Glucose-Capillary: 88 mg/dL (ref 65–99)

## 2016-07-10 MED ORDER — SODIUM CHLORIDE 0.9 % IV BOLUS (SEPSIS)
1000.0000 mL | Freq: Once | INTRAVENOUS | Status: AC
  Administered 2016-07-10: 1000 mL via INTRAVENOUS

## 2016-07-10 MED ORDER — OMEPRAZOLE 20 MG PO CPDR: 20.0000 mg | DELAYED_RELEASE_CAPSULE | Freq: Every day | ORAL | 0 refills | Status: AC

## 2016-07-10 MED ORDER — GI COCKTAIL ~~LOC~~
30.0000 mL | Freq: Once | ORAL | Status: AC
  Administered 2016-07-10: 30 mL via ORAL
  Filled 2016-07-10: qty 30

## 2016-07-10 MED ORDER — ONDANSETRON 4 MG PO TBDP
4.0000 mg | ORAL_TABLET | Freq: Once | ORAL | Status: AC
  Administered 2016-07-10: 4 mg via ORAL
  Filled 2016-07-10: qty 1

## 2016-07-10 NOTE — ED Triage Notes (Signed)
Onset 3-4 weeks pt has been having reflux burning problems and burping. Decreased food intake, fluid intake WNL. Onset today dizziness.

## 2016-07-10 NOTE — ED Notes (Signed)
Patient was able to eat saltine crackers, drink juice, and drink water without nausea for vomiting.

## 2016-07-10 NOTE — ED Provider Notes (Signed)
[facility]-EMERGENCY DEPT Provider Note   CSN: [id number] Arrival date & time: 07/10/16  1618     History   Chief Complaint Chief Complaint  Patient presents with  . Dizziness  . Gastroesophageal Reflux    HPI Virginia Mooney is a 35 y.o. female.  Virginia Mooney is a 35 y.o. Female who presents to the ED complaining of symptoms of acid reflux for the past 3-4 weeks. She reports a history of acid reflux and is previously taking omeprazole. She is not on any acid medications currently. Patient tells me she is not wanting to eat much because of her acid reflux. She denies any abdominal pain nausea or vomiting. She tells me today when standing up she felt briefly lightheaded. No syncope. She denies having lightheadedness dizzy currently. Patient denies fevers, coughing, shortness of breath, abdominal pain, nausea, vomiting, diarrhea, rashes, pain after eating, urinary symptoms or rashes.   The history is provided by the patient and the spouse. No language interpreter was used.  Dizziness  Associated symptoms: no chest pain, no diarrhea, no headaches, no nausea, no shortness of breath, no vomiting and no weakness   Gastroesophageal Reflux  Pertinent negatives include no chest pain, no abdominal pain, no headaches and no shortness of breath.    History reviewed. No pertinent past medical history.  Patient Active Problem List   Diagnosis Date Noted  . Headache(784.0) 02/08/2012    Past Surgical History:  Procedure Laterality Date  . CESAREAN SECTION      OB History    Gravida Para Term Preterm AB Living   2 1 1     1    SAB TAB Ectopic Multiple Live Births                   Home Medications    Prior to Admission medications   Medication Sig Start Date End Date Taking? Authorizing Provider  omeprazole (PRILOSEC) 20 MG capsule Take 1 capsule (20 mg total) by mouth daily. 07/10/16   Everlene Farrier, PA-C  sodium chloride (OCEAN NASAL SPRAY) 0.65 % nasal spray Place 1 spray  into the nose as needed for congestion. 02/07/12 02/06/13  Nelva Nay, MD    Family History History reviewed. No pertinent family history.  Social History Social History  Substance Use Topics  . Smoking status: Never Smoker  . Smokeless tobacco: Never Used  . Alcohol use No     Allergies   Propranolol   Review of Systems Review of Systems  Constitutional: Negative for chills and fever.  HENT: Negative for congestion and sore throat.   Eyes: Negative for visual disturbance.  Respiratory: Negative for cough, shortness of breath and wheezing.   Cardiovascular: Negative for chest pain.  Gastrointestinal: Negative for abdominal pain, diarrhea, nausea and vomiting.  Genitourinary: Negative for difficulty urinating, dysuria and frequency.  Musculoskeletal: Negative for back pain and neck pain.  Skin: Negative for rash.  Neurological: Positive for dizziness and light-headedness. Negative for syncope, weakness, numbness and headaches.     Physical Exam Updated Vital Signs BP 101/82   Pulse 79   Temp 98.8 F (37.1 C) (Oral)   Resp 19   Ht 5' (1.524 m)   Wt 59.2 kg   LMP 06/30/2016   SpO2 97%   Breastfeeding? No   BMI 25.51 kg/m   Physical Exam  Constitutional: She is oriented to person, place, and time. She appears well-developed and well-nourished. No distress.  Nontoxic appearing.  HENT:  Head: Normocephalic and atraumatic.  Mouth/Throat: Oropharynx is clear and moist.  Eyes: Conjunctivae are normal. Pupils are equal, round, and reactive to light. Right eye exhibits no discharge. Left eye exhibits no discharge.  Neck: Neck supple.  Cardiovascular: Normal rate, regular rhythm, normal heart sounds and intact distal pulses.  Exam reveals no gallop and no friction rub.   No murmur heard. Pulmonary/Chest: Effort normal and breath sounds normal. No respiratory distress. She has no wheezes. She has no rales.  Abdominal: Soft. Bowel sounds are normal. She exhibits no  mass. There is no tenderness. There is no guarding.  Abdomen is soft and nontender to palpation.  Musculoskeletal: She exhibits no edema.  Lymphadenopathy:    She has no cervical adenopathy.  Neurological: She is alert and oriented to person, place, and time. No cranial nerve deficit. Coordination normal.  Patient is alert and oriented 3. Speech is clear and Coherent.  Skin: Skin is warm and dry. Capillary refill takes less than 2 seconds. No rash noted. She is not diaphoretic. No erythema. No pallor.  Psychiatric: She has a normal mood and affect. Her behavior is normal.  Nursing note and vitals reviewed.    ED Treatments / Results  Labs (all labs ordered are listed, but only abnormal results are displayed) Labs Reviewed  BASIC METABOLIC PANEL - Abnormal; Notable for the following:       Result Value   Potassium 3.3 (*)    Glucose, Bld 112 (*)    All other components within normal limits  URINALYSIS, ROUTINE W REFLEX MICROSCOPIC - Abnormal; Notable for the following:    Color, Urine STRAW (*)    Specific Gravity, Urine 1.002 (*)    All other components within normal limits  HEPATIC FUNCTION PANEL - Abnormal; Notable for the following:    Total Protein 8.3 (*)    All other components within normal limits  CBC  CBG MONITORING, ED  I-STAT BETA HCG BLOOD, ED ([facility], [facility], [facility] ONLY)    EKG  EKG Interpretation None       Radiology No results found.  Procedures Procedures (including critical care time)  Medications Ordered in ED Medications  gi cocktail (Maalox,Lidocaine,Donnatal) (30 mLs Oral Given 07/10/16 2125)  ondansetron (ZOFRAN-ODT) disintegrating tablet 4 mg (4 mg Oral Given 07/10/16 2125)  sodium chloride 0.9 % bolus 1,000 mL (0 mLs Intravenous Stopped 07/10/16 2233)     Initial Impression / Assessment and Plan / ED Course  I have reviewed the triage vital signs and the nursing notes.  Pertinent labs & imaging results that were available during my care of the  patient were reviewed by me and considered in my medical decision making (see chart for details).  Clinical Course    This  is a 35 y.o. Female who presents to the ED complaining of symptoms of acid reflux for the past 3-4 weeks. She reports a history of acid reflux and is previously taking omeprazole. She is not on any acid medications currently. Patient tells me she is not wanting to eat much because of her acid reflux. She denies any abdominal pain nausea or vomiting. She tells me today when standing up she felt briefly lightheaded. No syncope. She denies having lightheadedness dizzy currently.  On exam the patient is afebrile nontoxic appearing. Her abdomen is soft and nontender to palpation. She is able to ambulate in the room without difficulty. She denies feeling lightheaded or dizzy with standing. Pregnancy test is negative. BMP is remarkable only for potassium of 3.3. CBC is within  normal limits. Urinalysis is without sign of infection. Hepatic function panel is unremarkable. She has no abdominal tenderness. I see no need for imaging at this time. Patient was provided with GI cocktail and reports this helps her symptoms greatly. She was able to tolerate by mouth without nausea, vomiting or any abdominal pain. Patient was also checked for orthostatics. It was noted that lying down her blood pressure was slightly low at 88/67 without tachycardia. She denied feeling ill or having any complaints or lying down. Upon standing her blood pressure improved to 106/71. She had no tachycardia. No lightheadedness or dizziness. When I discussed this with the patient she told me she felt fine while lying down and when standing up. She ambulates in the room with me without any difficulty. She tells me she is no longer feeling lightheaded or dizzy. She tells me she believes she was hungry and required something to eat. She feels ready for discharge and is feeling better now. Will discharge with omeprazole  prescription and discussed methods to help with acid reflux. I discussed strict and specific return precautions. I advised the patient to follow-up with their primary care provider this week. I advised the patient to return to the emergency department with new or worsening symptoms or new concerns. The patient verbalized understanding and agreement with plan.      Final Clinical Impressions(s) / ED Diagnoses   Final diagnoses:  Gastroesophageal reflux disease, esophagitis presence not specified  Lightheadedness    New Prescriptions Discharge Medication List as of 07/10/2016 10:59 PM       Everlene FarrierWilliam Derold Dorsch, PA-C 07/11/16 0148    Nira ConnPedro Eduardo Cardama, MD 07/11/16 [phone]

## 2018-01-02 ENCOUNTER — Other Ambulatory Visit
Admission: RE | Admit: 2018-01-02 | Discharge: 2018-01-02 | Disposition: A | Discharge status: Home / Self Care | Payer: Self-pay | Source: Ambulatory Visit | Attending: Occupational Medicine | Admitting: Occupational Medicine

## 2018-01-03 LAB — VARICELLA ZOSTER IGG AB: VZV IgG: POSITIVE

## 2018-01-03 LAB — RUBELLA ANTIBODY, IGG: Rubella IgG AB: POSITIVE

## 2018-01-03 LAB — MEASLES IGG AB: Measles IgG: POSITIVE

## 2018-11-10 NOTE — Progress Notes (Signed)
Called patient and left voicemail. Patient's New Patient Appointment has to be canceled. Patient will recieve a call at the end of May to schedule a June appointment.

## 2018-11-27 ENCOUNTER — Ambulatory Visit: Payer: Medicaid Other | Admitting: Family Medicine

## 2019-08-29 NOTE — Progress Notes (Signed)
Received an inquiry from husband that this patient is interested in becoming a new patient at [facility].  Attempted to contact the patient, unable to leave message for patient to return call.

## 2019-09-26 NOTE — Progress Notes (Signed)
Called patient to schedule a NPV, patient phone is not accepting calls.

## 2021-08-24 ENCOUNTER — Ambulatory Visit: Payer: Medicaid Other | Admitting: Obstetrics and Gynecology

## 2021-08-24 ENCOUNTER — Telehealth: Payer: Self-pay

## 2021-08-24 NOTE — Telephone Encounter (Signed)
lvm- PT 08/24/2021 appt has been canceled and will need to be rescheduled when pt returns call assist with scheduling NPV appt.

## 8387-03-20 DEATH — deceased
